# Patient Record
Sex: Male | Born: 1970 | Race: White | Hispanic: No | Marital: Single | Smoking: Never smoker
Health system: Southern US, Community
[De-identification: ages and names within clinical notes are randomized; demographics above are authoritative.]

---

## 2014-01-09 ENCOUNTER — Encounter (HOSPITAL_BASED_OUTPATIENT_CLINIC_OR_DEPARTMENT_OTHER): Payer: Self-pay | Admitting: Emergency Medicine

## 2014-01-09 ENCOUNTER — Emergency Department (HOSPITAL_BASED_OUTPATIENT_CLINIC_OR_DEPARTMENT_OTHER)
Admission: EM | Admit: 2014-01-09 | Discharge: 2014-01-09 | Disposition: A | Discharge status: Home / Self Care | Payer: PRIVATE HEALTH INSURANCE | Attending: Emergency Medicine | Admitting: Emergency Medicine

## 2014-01-09 ENCOUNTER — Emergency Department (HOSPITAL_BASED_OUTPATIENT_CLINIC_OR_DEPARTMENT_OTHER): Payer: PRIVATE HEALTH INSURANCE

## 2014-01-09 DIAGNOSIS — Y939 Activity, unspecified: Secondary | ICD-10-CM | POA: Insufficient documentation

## 2014-01-09 DIAGNOSIS — S6010XA Contusion of unspecified finger with damage to nail, initial encounter: Secondary | ICD-10-CM

## 2014-01-09 DIAGNOSIS — S61209A Unspecified open wound of unspecified finger without damage to nail, initial encounter: Secondary | ICD-10-CM | POA: Insufficient documentation

## 2014-01-09 DIAGNOSIS — Z23 Encounter for immunization: Secondary | ICD-10-CM | POA: Insufficient documentation

## 2014-01-09 DIAGNOSIS — W01119A Fall on same level from slipping, tripping and stumbling with subsequent striking against unspecified sharp object, initial encounter: Secondary | ICD-10-CM | POA: Insufficient documentation

## 2014-01-09 DIAGNOSIS — S61219A Laceration without foreign body of unspecified finger without damage to nail, initial encounter: Secondary | ICD-10-CM

## 2014-01-09 DIAGNOSIS — Y929 Unspecified place or not applicable: Secondary | ICD-10-CM | POA: Insufficient documentation

## 2014-01-09 DIAGNOSIS — S6000XA Contusion of unspecified finger without damage to nail, initial encounter: Secondary | ICD-10-CM | POA: Insufficient documentation

## 2014-01-09 MED ORDER — CEPHALEXIN 500 MG PO CAPS: 500.0000 mg | ORAL_CAPSULE | Freq: Four times a day (QID) | ORAL | Status: AC

## 2014-01-09 MED ORDER — HYDROCODONE-ACETAMINOPHEN 5-325 MG PO TABS: 1.0000 | ORAL_TABLET | Freq: Four times a day (QID) | ORAL | Status: AC | PRN

## 2014-01-09 MED ORDER — TETANUS-DIPHTH-ACELL PERTUSSIS 5-2.5-18.5 LF-MCG/0.5 IM SUSP
0.5000 mL | Freq: Once | INTRAMUSCULAR | Status: AC
  Administered 2014-01-09: 0.5 mL via INTRAMUSCULAR
  Filled 2014-01-09: qty 0.5

## 2014-01-09 NOTE — ED Notes (Signed)
Fell yesterday-cut left ring finger on "something"

## 2014-01-09 NOTE — ED Provider Notes (Signed)
CSN: [id number]     Arrival date & time 01/09/14  1947 History   First MD Initiated Contact with Patient 01/09/14 2102     Chief Complaint  Patient presents with  . Extremity Laceration     (Consider location/radiation/quality/duration/timing/severity/associated sxs/prior Treatment) HPI Comments: Pt states that he fell and cut her left index finger 2 days ago. Pt has not been seen. Pt is here because he saw some color change in the finger. Denies problems with movement. Pt is unsure of when his last tetanus was  The history is provided by the patient. No language interpreter was used.    History reviewed. No pertinent past medical history. History reviewed. No pertinent past surgical history. No family history on file. History  Substance Use Topics  . Smoking status: Never Smoker   . Smokeless tobacco: Not on file  . Alcohol Use: Yes    Review of Systems  Constitutional: Negative.   Respiratory: Negative.   Cardiovascular: Negative.       Allergies  Review of patient's allergies indicates no known allergies.  Home Medications   Current Outpatient Rx  Name  Route  Sig  Dispense  Refill  . cephALEXin (KEFLEX) 500 MG capsule   Oral   Take 1 capsule (500 mg total) by mouth 4 (four) times daily.   28 capsule   0   . HYDROcodone-acetaminophen (NORCO/VICODIN) 5-325 MG per tablet   Oral   Take 1-2 tablets by mouth every 6 (six) hours as needed.   10 tablet   0    BP 146/101  Pulse 93  Temp(Src) 98.3 F (36.8 C) (Oral)  Resp 16  Ht 5\' 8"  (1.727 m)  Wt 190 lb (86.183 kg)  BMI 28.90 kg/m2  SpO2 98% Physical Exam  Nursing note and vitals reviewed. Constitutional: He is oriented to person, place, and time. He appears well-developed and well-nourished.  Cardiovascular: Normal rate and regular rhythm.   Pulmonary/Chest: Effort normal and breath sounds normal.  Musculoskeletal: Normal range of motion.  Neurological: He is alert and oriented to person, place, and  time.  Skin:  Laceration noted to the distal aspect of the left ring finger that goes to the base of the nail. Discoloration noted to the under the nail. No drainage noted to the wound    ED Course  Procedures (including critical care time) Labs Review Labs Reviewed - No data to display Imaging Review Dg Finger Ring Left  01/09/2014   CLINICAL DATA: Injured left ring finger yesterday, fell at home and jammed it under a door. Laceration to the distal portion of the finger.  EXAM: LEFT RING FINGER 2+V  COMPARISON:  None.  FINDINGS: Gas within the soft tissues of the nail bed. No evidence of acute fracture or dislocation. Joint spaces well preserved. Well-preserved bone mineral density. No intrinsic osseous abnormalities.  IMPRESSION: No osseous abnormality.  Gas within the soft tissues of the nailbed.   Electronically Signed   By: Hulan Saashomas  Lawrence M.D.   On: 01/09/2014 21:58     EKG Interpretation None      MDM   Final diagnoses:  Finger laceration  Subungual hematoma of finger    Don't think wound should be sutured based on the length of time since the injury. Nailbed in intact. subungual hematoma drained by cautery. Will treat with keflex as risk of infection due to delayed healing. No fracture noted on x-ray. Pt given hand referral   Teressa LowerVrinda Nyajah Hyson, NP 01/09/14 2231

## 2014-01-09 NOTE — Discharge Instructions (Signed)
Laceration, Old, Not Sutured °A laceration is a cut or lesion that goes through all layers of the skin and into the tissue just beneath the skin. °Usually these are stitched up or held together with tape or glue shortly after an injury. However, if several or more hours have passed before getting care, too many germs (bacteria) get into the wound. Stitching it closed at this point brings the risk of infection. If your caregiver feels your laceration is too old, it is sometimes left open and dressed regularly to allow healing from the bottom layer up. °HOME CARE INSTRUCTIONS  °· You should change the dressing twice a day or as instructed by your caregiver. If the bandage or wound packing sticks, soak it off with soapy water. When you redress your wound, make sure that the dressing or packing goes all the way to the bottom of the wound. The top of the wound is kept open so it can heal from the bottom up. There is less chance for infection with this method. °· Twice a day, wash the area with soap and water to remove all the creams or ointments if used. Rinse off the soap. Pat dry with a clean towel. Look for signs of infection (see below). °· Re-apply creams or ointments if they were used to dress the wound. This also helps keep the bandage from sticking. °· If the bandage becomes wet, dirty, or develops a foul smell, change it as soon as possible. °· Only take over-the-counter or prescription medicines for pain, discomfort, or fever as directed by your caregiver. °You might need a tetanus shot now if: °· You have no idea when you had the last one. °· You have never had a tetanus shot before. °· Your cut had dirt in it. °· Your lacertaion was dirty, and your last tetanus shot was more than 7 years ago. °· Your laceration was clean, and your last tetanus shot was more than 10 years ago. °If you need a tetanus shot, and you decide not to get one, there is a rare chance of getting tetanus. Sickness from tetanus can be  serious. If you got a tetanus shot, your arm may swell, get red and warm to the touch at the shot site. This is common and not a problem. °SEEK MEDICAL CARE IF:  °· There is redness, swelling, or increasing pain in the wound. °· There is a red line that goes up your arm or leg. °· Pus is coming from wound. °· You develop an unexplained oral temperature above 102° F (38.9° C). °· You notice a foul smell coming from the wound or dressing. °· You notice something coming out of the wound such as wood or glass. °· The wound is on your hand or foot and you find that you are unable to properly move a finger or toe. °· There is severe swelling around the wound causing pain and numbness. °· There is a change in color in your arm, hand, leg, or foot. °MAKE SURE YOU:  °· Understand these instructions. °· Will watch your condition. °· Will get help right away if you are not doing well or get worse. °Document Released: 09/06/2006 Document Revised: 01/01/2012 Document Reviewed: 03/29/2009 °[employer]® Patient Information ©2014 [employer]. ° °

## 2014-01-10 NOTE — ED Provider Notes (Signed)
Medical screening examination/treatment/procedure(s) were performed by non-physician practitioner and as supervising physician I was immediately available for consultation/collaboration.   EKG Interpretation None        Junius ArgyleForrest S Yuritzy Zehring, MD 01/10/14 0030

## 2015-06-07 IMAGING — CR DG FINGER RING 2+V*L*
3 series · 3 of 3 positions shown · non-contrast
Comparison: None.

CLINICAL DATA: Injured left ring finger yesterday, fell at home and jammed it under
a door. Laceration to the distal portion of the finger.

EXAM:
LEFT RING FINGER 2+V

[x finger pa left]
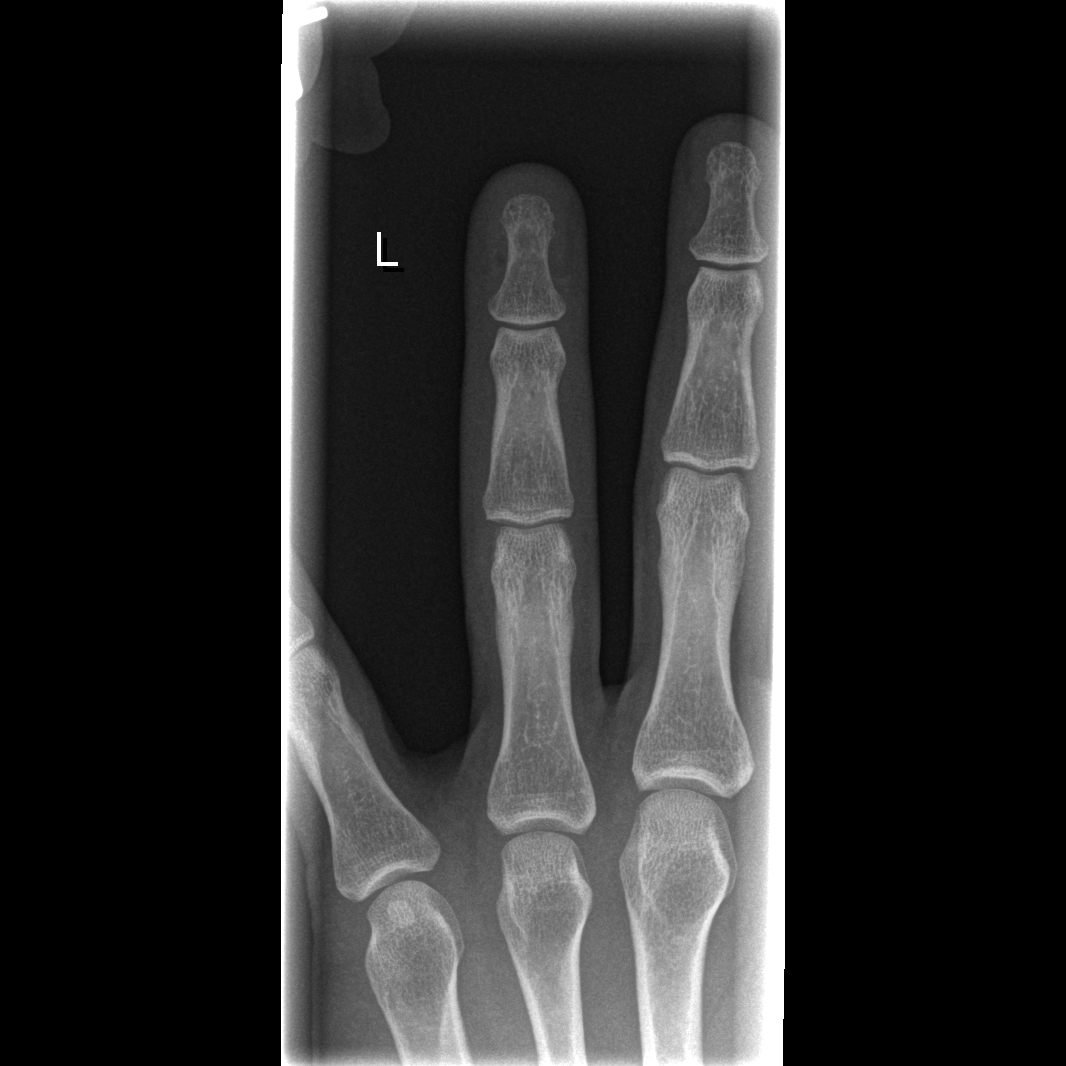

[x finger obl. left]
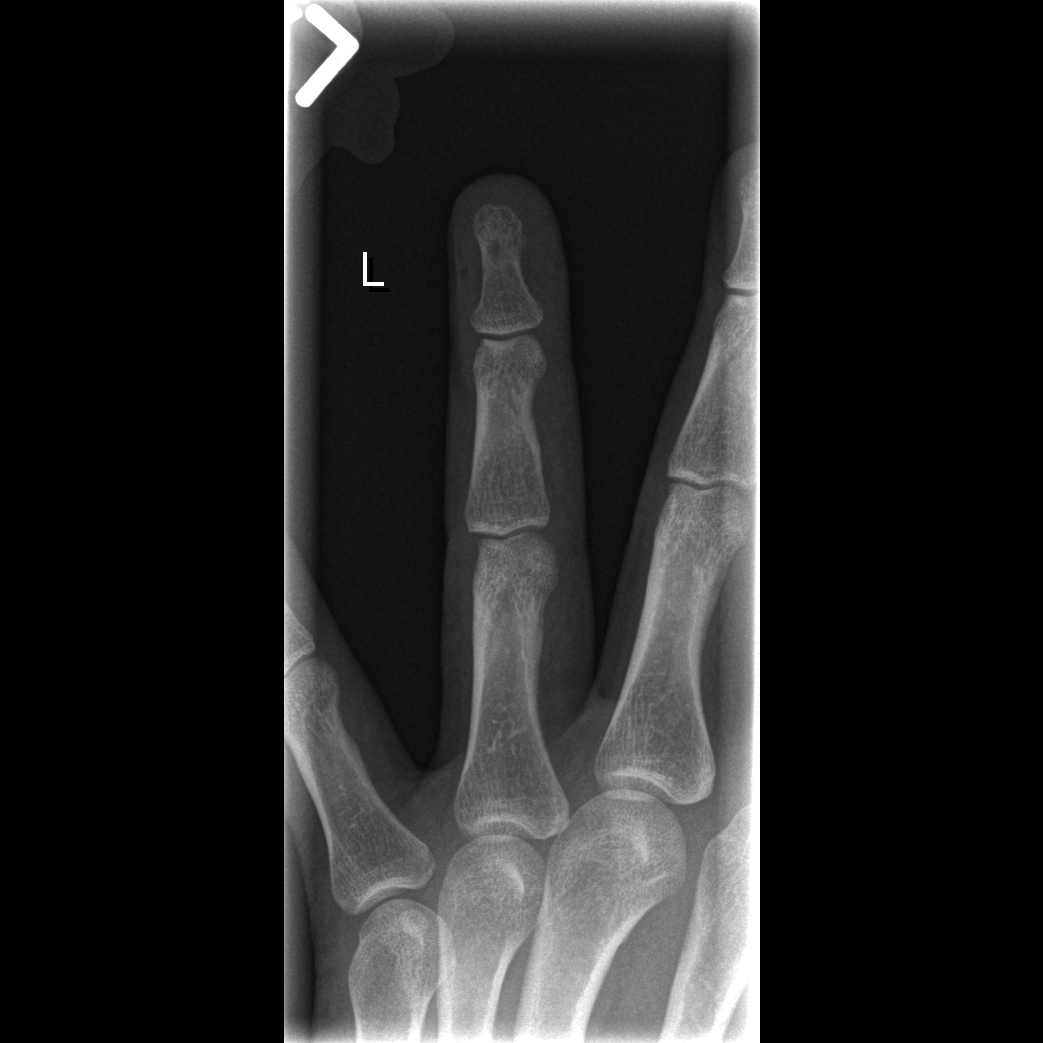

[x finger lateral left]
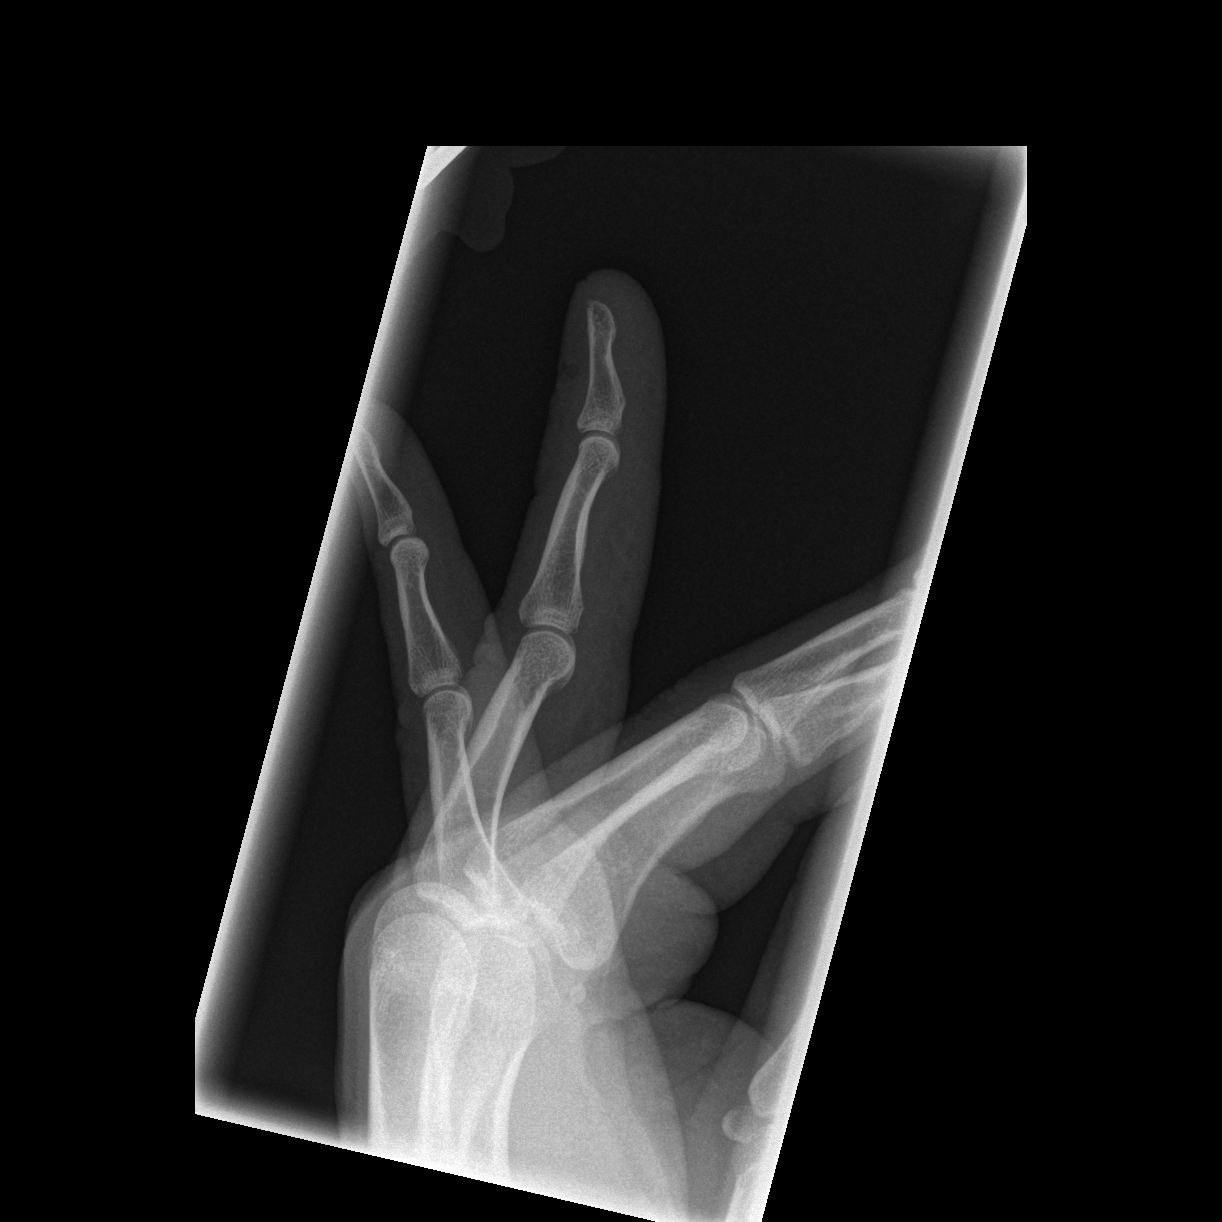

[3 of 3 positions shown; findings below may reference images not displayed]

FINDINGS: Gas within the soft tissues of the nail bed. No evidence of acute
fracture or dislocation. Joint spaces well preserved. Well-preserved
bone mineral density. No intrinsic osseous abnormalities.
IMPRESSION: No osseous abnormality.  Gas within the soft tissues of the nailbed.
